# Patient Record
Sex: Male | Born: 1970 | Race: White | Hispanic: No | Marital: Married | Smoking: Never smoker
Health system: Southern US, Community
[De-identification: ages and names within clinical notes are randomized; demographics above are authoritative.]

## PROBLEM LIST (undated history)

## (undated) DIAGNOSIS — A4902 Methicillin resistant Staphylococcus aureus infection, unspecified site: Secondary | ICD-10-CM

## (undated) HISTORY — PX: NASAL SINUS SURGERY: SHX719

---

## 2005-01-24 ENCOUNTER — Ambulatory Visit: Payer: Self-pay | Admitting: Otolaryngology

## 2005-07-03 ENCOUNTER — Ambulatory Visit: Payer: Self-pay | Admitting: Gastroenterology

## 2007-01-28 ENCOUNTER — Ambulatory Visit: Payer: Self-pay | Admitting: Orthopaedic Surgery

## 2009-07-09 ENCOUNTER — Ambulatory Visit: Payer: Self-pay | Admitting: Gastroenterology

## 2010-03-24 ENCOUNTER — Emergency Department: Payer: Self-pay | Admitting: Internal Medicine

## 2012-03-05 ENCOUNTER — Emergency Department: Payer: Self-pay | Admitting: Emergency Medicine

## 2012-03-05 LAB — URINALYSIS, COMPLETE
Bacteria: NONE SEEN
Bilirubin,UR: NEGATIVE
Blood: NEGATIVE
Glucose,UR: NEGATIVE mg/dL (ref 0–75)
Leukocyte Esterase: NEGATIVE
Nitrite: NEGATIVE
Ph: 5 (ref 4.5–8.0)
RBC,UR: 1 /HPF (ref 0–5)
Specific Gravity: 1.025 (ref 1.003–1.030)
Squamous Epithelial: NONE SEEN
WBC UR: 3 /HPF (ref 0–5)

## 2012-03-05 LAB — COMPREHENSIVE METABOLIC PANEL
Albumin: 3.8 g/dL (ref 3.4–5.0)
Anion Gap: 9 (ref 7–16)
BUN: 11 mg/dL (ref 7–18)
Chloride: 103 mmol/L (ref 98–107)
Co2: 29 mmol/L (ref 21–32)
EGFR (African American): >60
Glucose: 93 mg/dL (ref 65–99)
SGOT(AST): 23 U/L (ref 15–37)
SGPT (ALT): 33 U/L (ref 12–78)
Total Protein: 7.5 g/dL (ref 6.4–8.2)

## 2012-03-05 LAB — CBC
MCH: 29.7 pg (ref 26.0–34.0)
MCHC: 34.9 g/dL (ref 32.0–36.0)
MCV: 85 fL (ref 80–100)
Platelet: 267 10*3/uL (ref 150–440)
RDW: 12.3 % (ref 11.5–14.5)

## 2012-03-05 LAB — LIPASE, BLOOD: Lipase: 177 U/L (ref 73–393)

## 2012-03-06 ENCOUNTER — Ambulatory Visit: Payer: Self-pay | Admitting: Internal Medicine

## 2015-03-05 ENCOUNTER — Encounter: Payer: Self-pay | Admitting: Emergency Medicine

## 2015-03-05 ENCOUNTER — Emergency Department
Admission: EM | Admit: 2015-03-05 | Discharge: 2015-03-06 | Disposition: A | Discharge status: Home / Self Care | Payer: Worker's Compensation | Attending: Emergency Medicine | Admitting: Emergency Medicine

## 2015-03-05 ENCOUNTER — Emergency Department: Payer: Worker's Compensation

## 2015-03-05 DIAGNOSIS — Z23 Encounter for immunization: Secondary | ICD-10-CM | POA: Diagnosis not present

## 2015-03-05 DIAGNOSIS — S4992XA Unspecified injury of left shoulder and upper arm, initial encounter: Secondary | ICD-10-CM | POA: Insufficient documentation

## 2015-03-05 DIAGNOSIS — W010XXA Fall on same level from slipping, tripping and stumbling without subsequent striking against object, initial encounter: Secondary | ICD-10-CM | POA: Insufficient documentation

## 2015-03-05 DIAGNOSIS — S29001A Unspecified injury of muscle and tendon of front wall of thorax, initial encounter: Secondary | ICD-10-CM | POA: Diagnosis present

## 2015-03-05 DIAGNOSIS — Y9301 Activity, walking, marching and hiking: Secondary | ICD-10-CM | POA: Insufficient documentation

## 2015-03-05 DIAGNOSIS — S20212A Contusion of left front wall of thorax, initial encounter: Secondary | ICD-10-CM | POA: Insufficient documentation

## 2015-03-05 DIAGNOSIS — Y92219 Unspecified school as the place of occurrence of the external cause: Secondary | ICD-10-CM | POA: Diagnosis not present

## 2015-03-05 DIAGNOSIS — Y990 Civilian activity done for income or pay: Secondary | ICD-10-CM | POA: Insufficient documentation

## 2015-03-05 HISTORY — DX: Methicillin resistant Staphylococcus aureus infection, unspecified site: A49.02

## 2015-03-05 MED ORDER — TETANUS-DIPHTH-ACELL PERTUSSIS 5-2.5-18.5 LF-MCG/0.5 IM SUSP
0.5000 mL | Freq: Once | INTRAMUSCULAR | Status: AC
  Administered 2015-03-06: 0.5 mL via INTRAMUSCULAR
  Filled 2015-03-05: qty 0.5

## 2015-03-05 MED ORDER — OXYCODONE-ACETAMINOPHEN 5-325 MG PO TABS
1.0000 | ORAL_TABLET | Freq: Once | ORAL | Status: AC
  Administered 2015-03-06: 1 via ORAL
  Filled 2015-03-05: qty 1

## 2015-03-05 NOTE — ED Notes (Signed)
Patient transported back from Xray at this time.

## 2015-03-05 NOTE — ED Notes (Addendum)
Pt presents to ED after he tripped and fell landing on a metal rod that was sticking up approx "waist high". Pt states he hit the left side of his chest on the metal rod just under his clavicle. reddened area noted. Area very tender to touch. Pain increases with movement. Pt currently has no increased work of breathing or acute distress noted. Skin warm and dry.

## 2015-03-06 MED ORDER — OXYCODONE-ACETAMINOPHEN 5-325 MG PO TABS: 1.0000 | ORAL_TABLET | Freq: Four times a day (QID) | ORAL | Status: AC | PRN

## 2015-03-06 MED ORDER — NAPROXEN 500 MG PO TABS: 500.0000 mg | ORAL_TABLET | Freq: Two times a day (BID) | ORAL | Status: AC

## 2015-03-06 MED ORDER — HYDROMORPHONE HCL 1 MG/ML IJ SOLN
1.0000 mg | Freq: Once | INTRAMUSCULAR | Status: AC
  Administered 2015-03-06: 1 mg via INTRAVENOUS

## 2015-03-06 MED ORDER — HYDROMORPHONE HCL 1 MG/ML IJ SOLN
INTRAMUSCULAR | Status: AC
  Administered 2015-03-06: 1 mg via INTRAVENOUS
  Filled 2015-03-06: qty 1

## 2015-03-06 NOTE — ED Provider Notes (Signed)
[facility] Emergency Department Provider Note  ____________________________________________  Time seen: 11:20 PM  I have reviewed the triage vital signs and the nursing notes.   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 He fell onto a blunt edge on the left chest and then onto his left shoulder. He complains of pain only in the left chest. No shortness of breath but it does hurt to breathe or move. Right to this he was in good health. He denies any head injury or loss of consciousness. No palpitations.     Past Medical History  Diagnosis Date  . MRSA (methicillin resistant Staphylococcus aureus)      There are no active problems to display for this patient.    Past Surgical History  Procedure Laterality Date  . Nasal sinus surgery       Current Outpatient Rx  Name  Route  Sig  Dispense  Refill  . naproxen (NAPROSYN) 500 MG tablet   Oral   Take 1 tablet (500 mg total) by mouth 2 (two) times daily with a meal.   20 tablet   0   . oxyCODONE-acetaminophen (ROXICET) 5-325 MG per tablet   Oral   Take 1 tablet by mouth every 6 (six) hours as needed for severe pain.   6 tablet   0      Allergies Review of patient's allergies indicates no known allergies.   History reviewed. No pertinent family history.  Social History Social History  Substance Use Topics  . Smoking status: Never Smoker   . Smokeless tobacco: None  . Alcohol Use: No    Review of Systems  Constitutional:   No fever or chills. No weight changes Eyes:   No blurry vision or double vision.  ENT:   No sore throat. Cardiovascular:   Chest wall pain as above Respiratory:   No  dyspnea or cough. Gastrointestinal:   Negative for abdominal pain, vomiting and diarrhea.  No BRBPR or melena. Genitourinary:   Negative for dysuria, urinary retention, bloody urine, or difficulty urinating. Musculoskeletal:   Negative for back pain. No joint swelling or pain. Skin:   Negative for rash. Neurological:   Negative for headaches, focal weakness or numbness. Psychiatric:  No anxiety or depression.   Endocrine:  No hot/cold intolerance, changes in energy, or sleep difficulty.  10-point ROS otherwise negative.  ____________________________________________   PHYSICAL EXAM:  VITAL SIGNS: ED Triage Vitals  Enc Vitals Group     BP 03/05/15 2324 139/95 mmHg     Pulse Rate 03/05/15 2324 65     Resp 03/05/15 2324 22     Temp 03/05/15 2324 97.8 F (36.6 C)     Temp Source 03/05/15 2324 Oral     SpO2 03/05/15 2324 96 %     Weight 03/05/15 2324 155 lb (70.308 kg)     Height 03/05/15 2324 5\' 6"  (1.676 m)     Head Cir --      Peak Flow --      Pain Score 03/05/15 2325 5     Pain Loc --      Pain Edu? --      Excl. in  GC? --      Constitutional:   Alert and oriented. Well appearing and in no distress. Eyes:   No scleral icterus. No conjunctival pallor. PERRL. EOMI ENT   Head:   Normocephalic and atraumatic.   Nose:   No congestion/rhinnorhea. No septal hematoma   Mouth/Throat:   MMM, no pharyngeal erythema. No peritonsillar mass. No uvula shift.   Neck:   No stridor. No SubQ emphysema. No meningismus. Hematological/Lymphatic/Immunilogical:   No cervical lymphadenopathy. Cardiovascular:   RRR. Normal and symmetric distal pulses are present in all extremities. No murmurs, rubs, or gallops. There is a large elliptical abrasion and bruise approximately 6 cm in diameter on the left anterior superior chest overlying the second and third ribs. No step-off or instability. Tender to the touch. No laceration. Respiratory:   Normal respiratory effort without tachypnea  nor retractions. Breath sounds are clear and equal bilaterally. No wheezes/rales/rhonchi. Gastrointestinal:   Soft and nontender. No distention. There is no CVA tenderness.  No rebound, rigidity, or guarding. Genitourinary:   deferred Musculoskeletal:   Nontender with normal range of motion in all extremities. No joint effusions.  No lower extremity tenderness.  No edema. Neurologic:   Normal speech and language.  CN 2-10 normal. Motor grossly intact. No pronator drift.  Normal gait. No gross focal neurologic deficits are appreciated.  Skin:    Skin is warm, dry and intact. No rash noted.  No petechiae, purpura, or bullae. Psychiatric:   Mood and affect are normal. Speech and behavior are normal. Patient exhibits appropriate insight and judgment.  ____________________________________________    LABS (pertinent positives/negatives) (all labs ordered are listed, but only abnormal results are displayed) Labs Reviewed - No data to display ____________________________________________   EKG    ____________________________________________    RADIOLOGY  Chest x-ray unremarkable  ____________________________________________   PROCEDURES   ____________________________________________   INITIAL IMPRESSION / ASSESSMENT AND PLAN / ED COURSE  Pertinent labs & imaging results that were available during my care of the patient were reviewed by me and considered in my medical decision making (see chart for details).  Blunt chest trauma resulting in chest contusion. Low suspicion for any commotio cordis or other complicating issues. Patient is well-appearing. No pneumothorax. No laceration. Tetanus shot given. Pain controlled. We'll discharge home.     ____________________________________________   FINAL CLINICAL IMPRESSION(S) / ED DIAGNOSES  Final diagnoses:  Chest wall contusion, left, initial encounter      Sharman Cheek, MD 03/06/15 0144

## 2015-03-06 NOTE — Discharge Instructions (Signed)
You were prescribed a medication that is potentially sedating. Do not drink alcohol, drive or participate in any other potentially dangerous activities while taking this medication as it may make you sleepy. Do not take this medication with any other sedating medications, either prescription or over-the-counter. If you were prescribed Percocet or Vicodin, do not take these with acetaminophen (Tylenol) as it is already contained within these medications.   Opioid pain medications (or "narcotics") can be habit forming.  Use it as little as possible to achieve adequate pain control.  Do not use or use it with extreme caution if you have a history of opiate abuse or dependence.  If you are on a pain contract with your primary care doctor or a pain specialist, be sure to let them know you were prescribed this medication today from the [facility] Emergency Department.  This medication is intended for your use only - do not give any to anyone else and keep it in a secure place where nobody else, especially children and pets, have access to it.  It will also cause or worsen constipation, so you may want to consider taking an over-the-counter stool softener while you are taking this medication.   Chest Contusion A chest contusion is a deep bruise on your chest area. Contusions are the result of an injury that caused bleeding under the skin. A chest contusion may involve bruising of the skin, muscles, or ribs. The contusion may turn blue, purple, or yellow. Minor injuries will give you a painless contusion, but more severe contusions may stay painful and swollen for a few weeks. CAUSES  A contusion is usually caused by a blow, trauma, or direct force to an area of the body. SYMPTOMS   Swelling and redness of the injured area.  Discoloration of the injured area.  Tenderness and soreness of the injured area.  Pain. DIAGNOSIS  The diagnosis can be made by taking a history and performing a physical exam.  An X-ray, CT scan, or MRI may be needed to determine if there were any associated injuries, such as broken bones (fractures) or internal injuries. TREATMENT  Often, the best treatment for a chest contusion is resting, icing, and applying cold compresses to the injured area. Deep breathing exercises may be recommended to reduce the risk of pneumonia. Over-the-counter medicines may also be recommended for pain control. HOME CARE INSTRUCTIONS   Put ice on the injured area.  Put ice in a plastic bag.  Place a towel between your skin and the bag.  Leave the ice on for 15-20 minutes, 03-04 times a day.  Only take over-the-counter or prescription medicines as directed by your caregiver. Your caregiver may recommend avoiding anti-inflammatory medicines (aspirin, ibuprofen, and naproxen) for 48 hours because these medicines may increase bruising.  Rest the injured area.  Perform deep-breathing exercises as directed by your caregiver.  Stop smoking if you smoke.  Do not lift objects over 5 pounds (2.3 kg) for 3 days or longer if recommended by your caregiver. SEEK IMMEDIATE MEDICAL CARE IF:   You have increased bruising or swelling.  You have pain that is getting worse.  You have difficulty breathing.  You have dizziness, weakness, or fainting.  You have blood in your urine or stool.  You cough up or vomit blood.  Your swelling or pain is not relieved with medicines. MAKE SURE YOU:   Understand these instructions.  Will watch your condition.  Will get help right away if you are not doing  well or get worse. Document Released: 02/28/2001 Document Revised: 02/28/2012 Document Reviewed: 11/27/2011 [facility] Patient Information 2015 [place], [place]. This information is not intended to replace advice given to you by your health care provider. Make sure you discuss any questions you have with your health care provider.

## 2016-12-01 ENCOUNTER — Other Ambulatory Visit: Payer: Self-pay | Admitting: Orthopedic Surgery

## 2016-12-01 DIAGNOSIS — M5440 Lumbago with sciatica, unspecified side: Secondary | ICD-10-CM

## 2016-12-01 DIAGNOSIS — M79605 Pain in left leg: Secondary | ICD-10-CM

## 2016-12-01 DIAGNOSIS — M545 Low back pain, unspecified: Secondary | ICD-10-CM

## 2016-12-27 ENCOUNTER — Ambulatory Visit
Admission: RE | Admit: 2016-12-27 | Discharge: 2016-12-27 | Disposition: A | Discharge status: Home / Self Care | Payer: BC Managed Care – PPO | Source: Ambulatory Visit | Attending: Orthopedic Surgery | Admitting: Orthopedic Surgery

## 2016-12-27 DIAGNOSIS — M48061 Spinal stenosis, lumbar region without neurogenic claudication: Secondary | ICD-10-CM | POA: Insufficient documentation

## 2016-12-27 DIAGNOSIS — M5440 Lumbago with sciatica, unspecified side: Secondary | ICD-10-CM | POA: Diagnosis not present

## 2016-12-27 DIAGNOSIS — M79605 Pain in left leg: Secondary | ICD-10-CM | POA: Diagnosis present

## 2016-12-27 DIAGNOSIS — M5136 Other intervertebral disc degeneration, lumbar region: Secondary | ICD-10-CM | POA: Diagnosis not present

## 2016-12-27 DIAGNOSIS — M545 Low back pain, unspecified: Secondary | ICD-10-CM

## 2018-11-01 IMAGING — MR MR LUMBAR SPINE W/O CM
5 series · 37 of 48 positions shown · non-contrast
Comparison: None.

CLINICAL DATA: 46 y/o M; NKI; No surg; c/o LBP with left leg pain
and numbness x1 year.

EXAM:
MRI LUMBAR SPINE WITHOUT CONTRAST
TECHNIQUE: Multiplanar, multisequence MR imaging of the lumbar spine was
performed. No intravenous contrast was administered.

[Series 2: T2 · sagittal · 4.0mm · 0.81mm/px · 6 of 15 slices shown (1 of 2)]
[im 1/15]
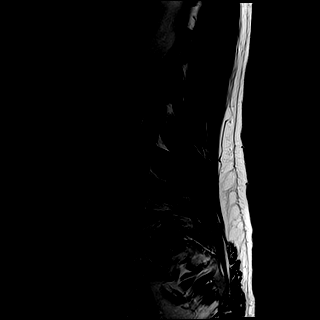
[im 3/15]
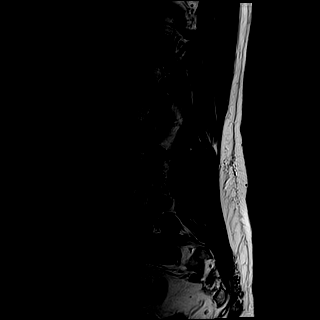
[im 6/15]
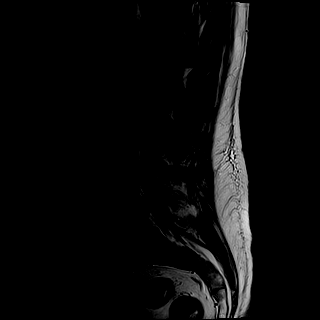
[im 9/15]
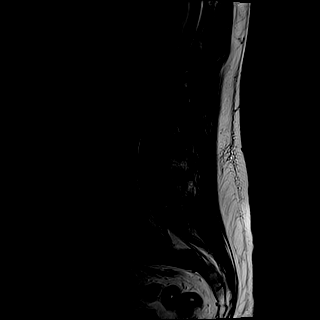
[im 12/15]
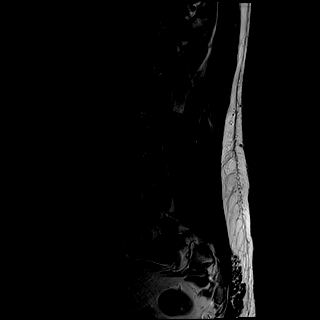
[im 15/15]
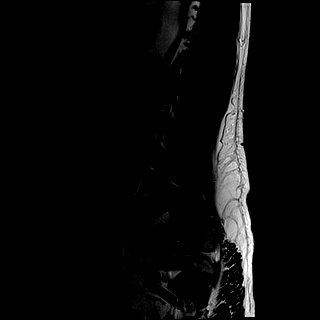

[Series 3: T1 · sagittal · 4.0mm · 0.81mm/px · 6 of 15 slices shown (1 of 2)]
[im 1/15]
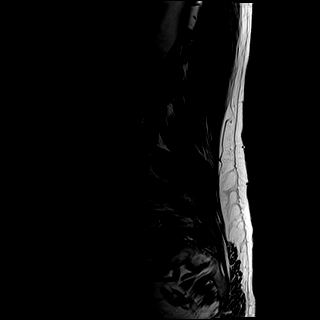
[im 3/15]
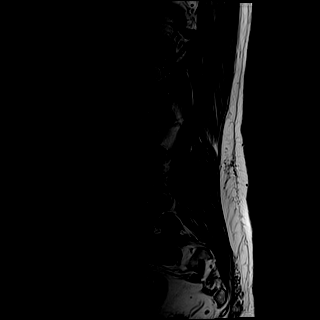
[im 6/15]
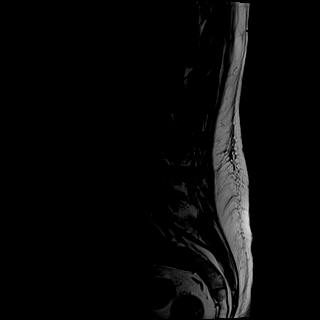
[im 9/15]
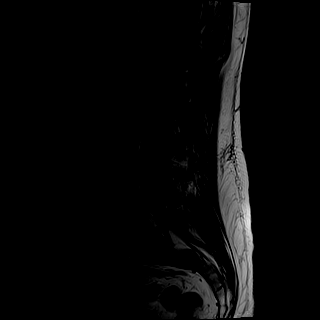
[im 12/15]
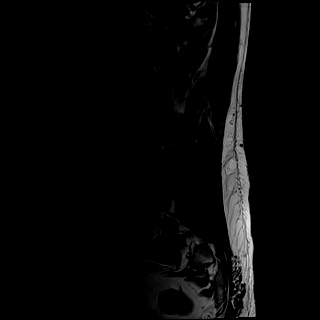
[im 15/15]
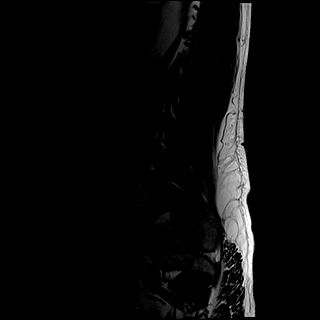

[Series 4: STIR · sagittal · 4.0mm · 1.02mm/px · 6 of 15 slices shown]
[im 1/15]
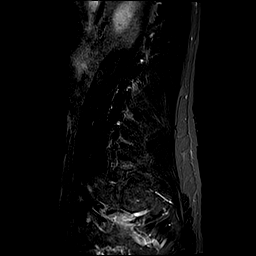
[im 3/15]
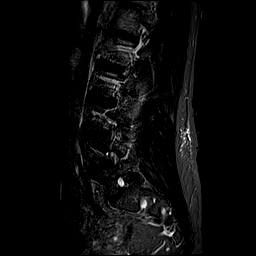
[im 6/15]
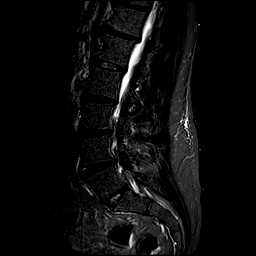
[im 9/15]
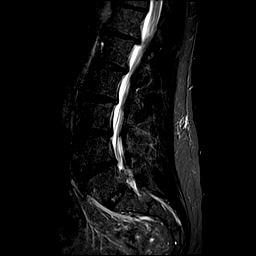
[im 12/15]
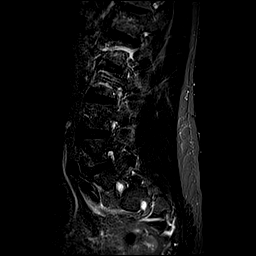
[im 15/15]
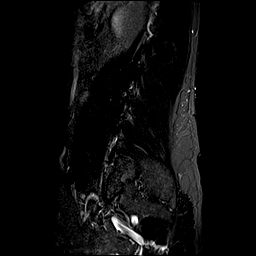

[Series 6: T1 · axial · 4.0mm · 0.39mm/px · z∈[-38,+193]mm · 9 of 39 slices shown (2 of 2)]
[im 1/39]
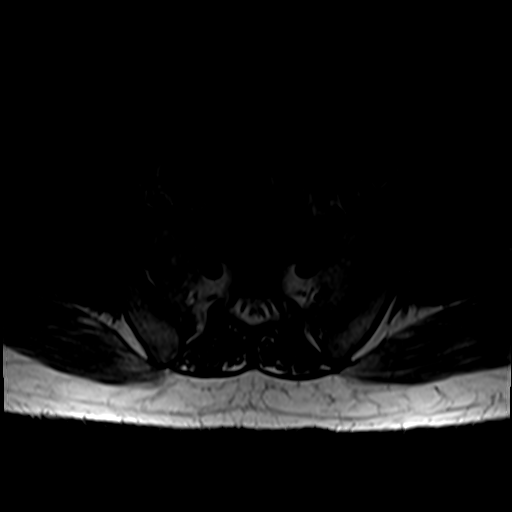
[im 6/39]
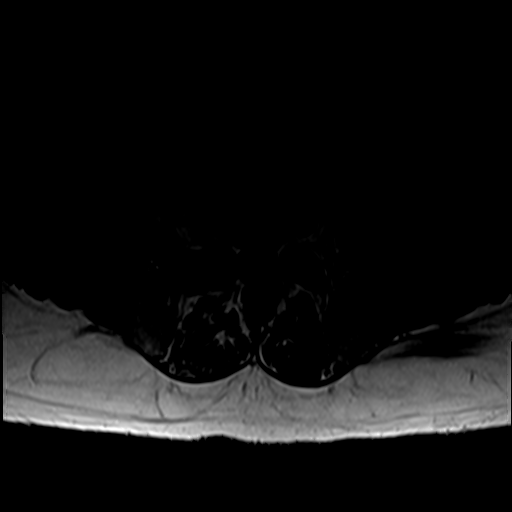
[im 11/39]
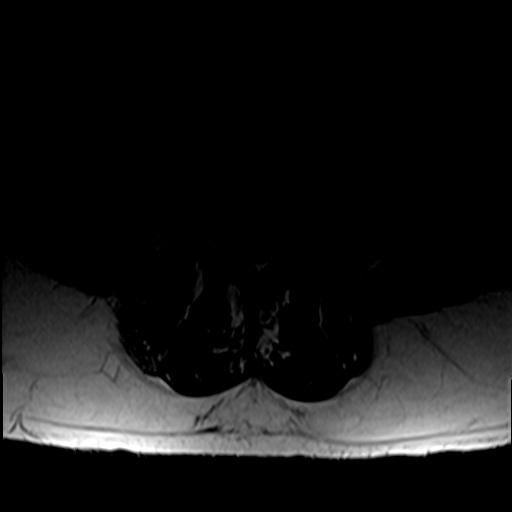
[im 17/39]
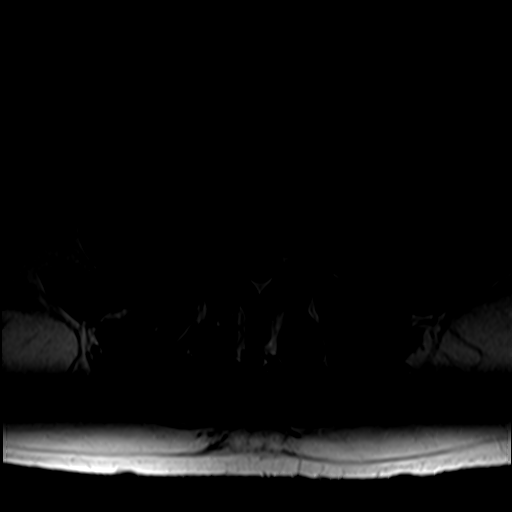
[im 20/39]
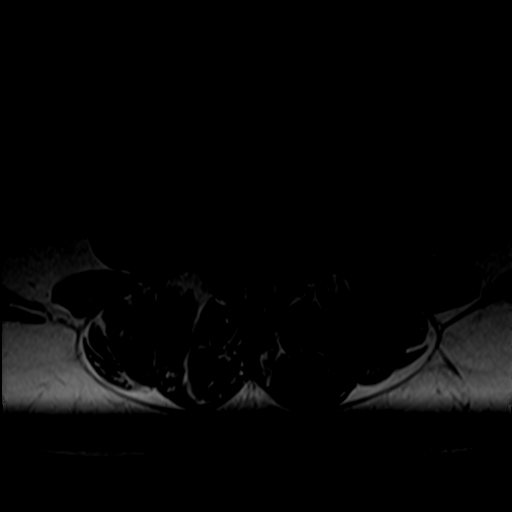
[im 22/39]
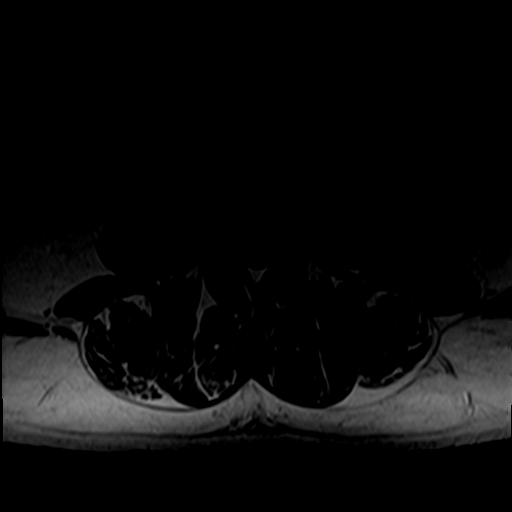
[im 28/39]
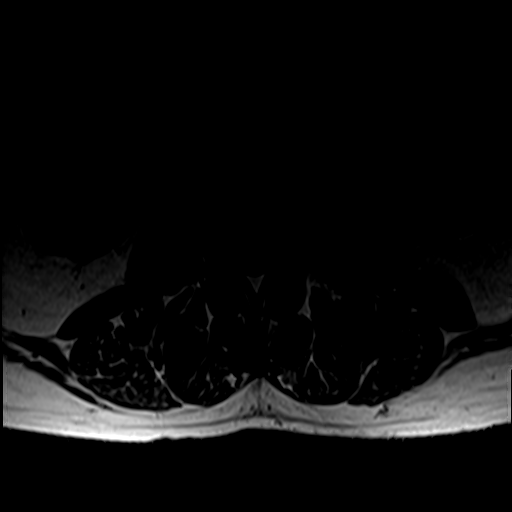
[im 33/39]
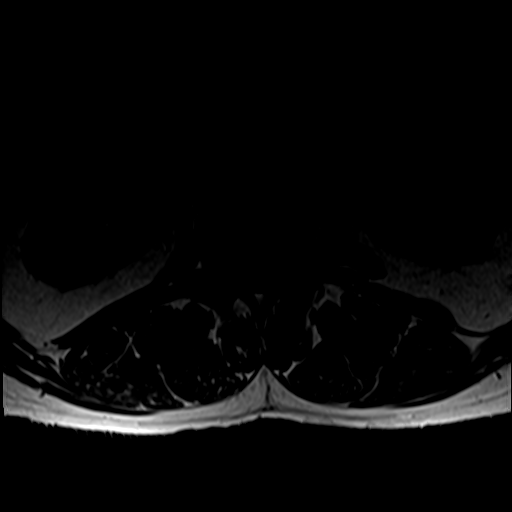
[im 39/39]
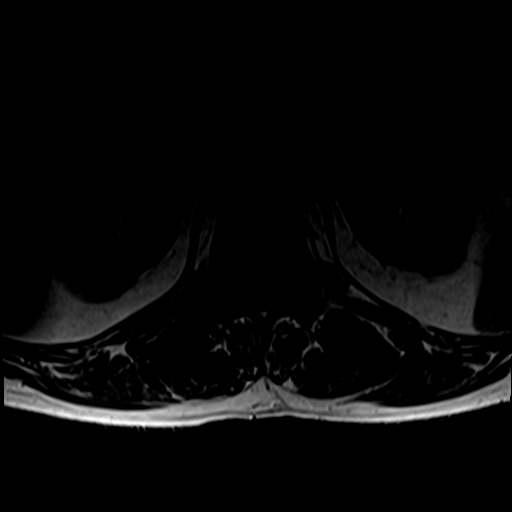

[Series 7: T2 · axial · 4.0mm · 0.78mm/px · z∈[-38,+193]mm · 10 of 39 slices shown (2 of 2)]
[im 1/39]
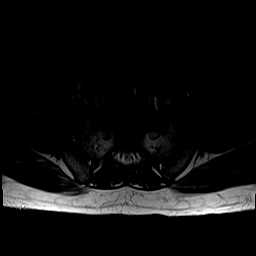
[im 3/39]
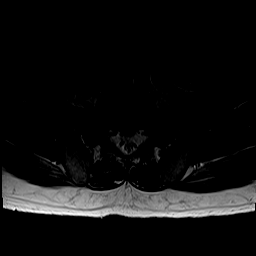
[im 6/39]
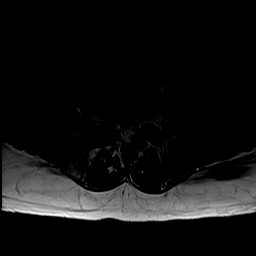
[im 11/39]
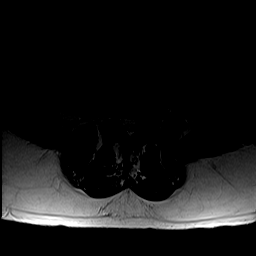
[im 17/39]
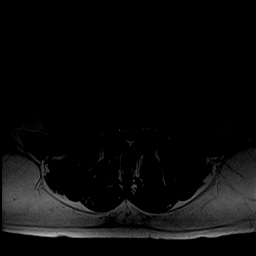
[im 20/39]
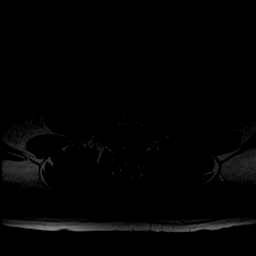
[im 22/39]
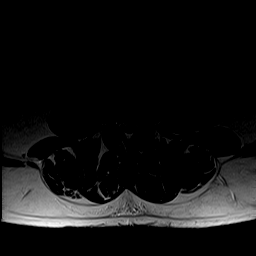
[im 28/39]
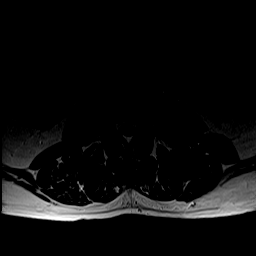
[im 33/39]
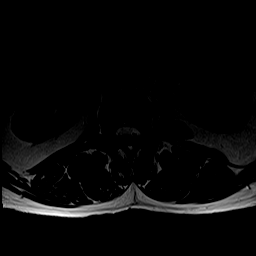
[im 39/39]
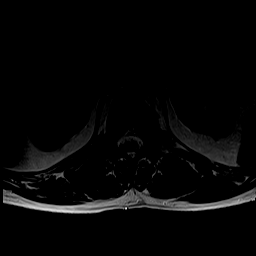

[37 of 48 positions shown; findings below may reference images not displayed]

FINDINGS: Segmentation: L5 left transverse process articulation with the
sacral ala.

Alignment:  Physiologic.

Vertebrae:  No fracture, evidence of discitis, or bone lesion.

Conus medullaris: Extends to the L1 level and appears normal.

Paraspinal and other soft tissues: Negative.

Disc levels:

T12-L1: Small disk bulge eccentric to the left. Mild facet
hypertrophy.Mild left foraminal stenosis. No significant canal
stenosis.

L1-2: Small disk bulge eccentric to the left. Mild facet
hypertrophy.Mild left foraminal stenosis. No significant canal
stenosis.

L2-3: Small disk bulge, 3mm central protrusion, and mild facet
hypertrophy. Mild bilateral foraminal and canal stenosis.

L3-4: Small disk bulge eccentric to the right. Mild facet
hypertrophy.Mild right foraminal and canal stenosis.

L4-5: Small disk bulge and mild facet hypertrophy. Mild bilateral
foraminal and canal stenosis.

L5-S1: Minimal disk bulge and mild facet hypertrophy. No significant
foraminal narrowing or canal stenosis.
IMPRESSION: 1.  No acute osseous abnormality.

2. Mild multifactorial L2-3 canal stenosis, no high grade canal
stenosis.

3. Multilevel mild foraminal stenosis, no high grade foraminal
stenosis.

By: Antone Balard M.D.
